# Patient Record
Sex: Male | Born: 1980 | Race: Black or African American | Hispanic: No | Marital: Single | Smoking: Light tobacco smoker
Health system: Southern US, Community
[De-identification: ages and names within clinical notes are randomized; demographics above are authoritative.]

## PROBLEM LIST (undated history)

## (undated) DIAGNOSIS — W3400XA Accidental discharge from unspecified firearms or gun, initial encounter: Secondary | ICD-10-CM

## (undated) HISTORY — PX: ANKLE FRACTURE SURGERY: SHX122

---

## 2013-09-19 ENCOUNTER — Emergency Department (HOSPITAL_COMMUNITY)
Admission: EM | Admit: 2013-09-19 | Discharge: 2013-09-19 | Disposition: A | Discharge status: Home / Self Care | Payer: Self-pay | Attending: Emergency Medicine | Admitting: Emergency Medicine

## 2013-09-19 ENCOUNTER — Encounter (HOSPITAL_COMMUNITY): Payer: Self-pay | Admitting: Emergency Medicine

## 2013-09-19 DIAGNOSIS — K089 Disorder of teeth and supporting structures, unspecified: Secondary | ICD-10-CM | POA: Insufficient documentation

## 2013-09-19 DIAGNOSIS — K029 Dental caries, unspecified: Secondary | ICD-10-CM | POA: Insufficient documentation

## 2013-09-19 DIAGNOSIS — K0889 Other specified disorders of teeth and supporting structures: Secondary | ICD-10-CM

## 2013-09-19 MED ORDER — HYDROCODONE-ACETAMINOPHEN 5-325 MG PO TABS
ORAL_TABLET | ORAL | Status: DC
Start: 2013-09-19 — End: 2018-03-16

## 2013-09-19 MED ORDER — CLINDAMYCIN HCL 300 MG PO CAPS: 300.0000 mg | ORAL_CAPSULE | Freq: Four times a day (QID) | ORAL | Status: AC

## 2013-09-19 MED ORDER — CLINDAMYCIN HCL 150 MG PO CAPS
300.0000 mg | ORAL_CAPSULE | Freq: Once | ORAL | Status: AC
  Administered 2013-09-19: 300 mg via ORAL
  Filled 2013-09-19: qty 2

## 2013-09-19 MED ORDER — HYDROCODONE-ACETAMINOPHEN 5-325 MG PO TABS: ORAL_TABLET | ORAL | Status: DC

## 2013-09-19 MED ORDER — OXYCODONE-ACETAMINOPHEN 5-325 MG PO TABS
1.0000 | ORAL_TABLET | Freq: Once | ORAL | Status: AC
  Administered 2013-09-19: 1 via ORAL
  Filled 2013-09-19: qty 1

## 2013-09-19 MED ORDER — ONDANSETRON 8 MG PO TBDP
8.0000 mg | ORAL_TABLET | Freq: Once | ORAL | Status: AC
  Administered 2013-09-19: 8 mg via ORAL
  Filled 2013-09-19: qty 1

## 2013-09-19 MED ORDER — CLINDAMYCIN HCL 300 MG PO CAPS: 300.0000 mg | ORAL_CAPSULE | Freq: Four times a day (QID) | ORAL | Status: DC

## 2013-09-19 NOTE — ED Notes (Signed)
Pt states he has pain to upper teeth area and that some "substance" keep draining and it's making him sick on his stomach. States he has been taking Motrin for last 3 days but nothing is helping.

## 2013-09-19 NOTE — Discharge Instructions (Signed)
Dental Pain  Toothache is pain in or around a tooth. It may get worse with chewing or with cold or heat.   HOME CARE  · Your dentist may use a numbing medicine during treatment. If so, you may need to avoid eating until the medicine wears off. Ask your dentist about this.  · Only take medicine as told by your dentist or doctor.  · Avoid chewing food near the painful tooth until after all treatment is done. Ask your dentist about this.  GET HELP RIGHT AWAY IF:   · The problem gets worse or new problems appear.  · You have a fever.  · There is redness and puffiness (swelling) of the face, jaw, or neck.  · You cannot open your mouth.  · There is pain in the jaw.  · There is very bad pain that is not helped by medicine.  MAKE SURE YOU:   · Understand these instructions.  · Will watch your condition.  · Will get help right away if you are not doing well or get worse.  Document Released: 08/06/2007 Document Revised: 05/12/2011 Document Reviewed: 08/06/2007  [employer]® Patient Information ©2015 [employer]. This information is not intended to replace advice given to you by your health care provider. Make sure you discuss any questions you have with your health care provider.

## 2013-09-19 NOTE — ED Provider Notes (Signed)
CSN: [id number]     Arrival date & time 09/19/13  1147 History  This chart was scribed for non-physician practitioner Pauline Aus, PA-C  working with Rolland Porter, MD, by Andrew Au, ED Scribe. This patient was seen in room APFT22/APFT22 and the patient's care was started at 12:46 PM. Chief Complaint  Patient presents with  . Abscess  . Dental Pain   Patient is a 33 y.o. male presenting with abscess and tooth pain. The history is provided by the patient. No language interpreter was used.  Abscess Associated symptoms: no fever and no headaches   Dental Pain Location:  Upper Associated symptoms: no congestion, no facial swelling, no fever, no headaches and no neck pain    Russell Mayer is a 33 y.o. male who presents to the Emergency Department complaining of burning worsening upper dental pain onset 1 week. Pt reports pain worsened within the last couple of days with associated drainage from tooth, facial pain to eye, ear and jaw, gum swelling and pain with chewing. Reports drainage from tooth make him feel sick.  Pt states he's been taking motrin without relief to pain. Pt denies making an appointment with dentist   No past medical history on file. No past surgical history on file. No family history on file. History  Substance Use Topics  . Smoking status: Not on file  . Smokeless tobacco: Not on file  . Alcohol Use: Not on file    Review of Systems  Constitutional: Negative for fever and appetite change.  HENT: Positive for dental problem. Negative for congestion, facial swelling, sore throat, tinnitus and trouble swallowing.   Eyes: Negative for pain and visual disturbance.  Respiratory: Negative for chest tightness and shortness of breath.   Musculoskeletal: Negative for neck pain and neck stiffness.  Neurological: Negative for dizziness, facial asymmetry and headaches.  Hematological: Negative for adenopathy.  All other systems reviewed and are negative.   Allergies   Review of patient's allergies indicates no known allergies.  Home Medications   Prior to Admission medications   Medication Sig Start Date End Date Taking? Authorizing Provider  ibuprofen (ADVIL,MOTRIN) 200 MG tablet Take 800 mg by mouth every 6 (six) hours as needed for moderate pain.   Yes Historical Provider, MD   Triage Vitals- BP 125/74  Pulse 67  Temp(Src) 98.1 F (36.7 C) (Oral)  Resp 18  Ht 5\' 8"  (1.727 m)  Wt 165 lb (74.844 kg)  BMI 25.09 kg/m2  SpO2 100% Physical Exam  Nursing note and vitals reviewed. Constitutional: He is oriented to person, place, and time. He appears well-developed and well-nourished. No distress.  HENT:  Head: Normocephalic and atraumatic.  Right Ear: Tympanic membrane and ear canal normal.  Left Ear: Tympanic membrane and ear canal normal.  Mouth/Throat: Uvula is midline, oropharynx is clear and moist and mucous membranes are normal. No trismus in the jaw. Dental caries present. No dental abscesses or uvula swelling.    TTP of the right upper central incisor with mild to moderate edema to surrounding gum. No drainage no facial edema or trismus.   Eyes: Conjunctivae and EOM are normal.  Neck: Normal range of motion. Neck supple.  Cardiovascular: Normal rate, regular rhythm and normal heart sounds.  Exam reveals no gallop and no friction rub.   No murmur heard. Pulmonary/Chest: Effort normal and breath sounds normal. No respiratory distress. He has no wheezes. He has no rales. He exhibits no tenderness.  Musculoskeletal: Normal range of motion.  Lymphadenopathy:  He has no cervical adenopathy.  Neurological: He is alert and oriented to person, place, and time. He exhibits normal muscle tone. Coordination normal.  Skin: Skin is warm and dry.  Psychiatric: He has a normal mood and affect. His behavior is normal.    ED Course  Procedures  DIAGNOSTIC STUDIES: Oxygen Saturation is 100% on RA, normal by my interpretation.    COORDINATION OF  CARE: 12:50 PM- Pt advised of plan for treatment which includes Abx and pt agrees. Pt has been advised to eat soft foods and to make an appointment with a dentist.  Labs Review Labs Reviewed - No data to display  Imaging Review No results found.   EKG Interpretation None      MDM   Final diagnoses:  Pain, dental   VSS.  Pt is non-toxic appearing.  No concerning sx's for infection to floor of the mouth or deep structures of the neck.  Pt agrees to close dental f/u , clindamycin and #15 vicodin for pain  I personally performed the services described in this documentation, which was scribed in my presence. The recorded information has been reviewed and is accurate.      Makinzie Considine L. Olive Zmuda, PA-C 09/22/13 1332

## 2013-09-30 NOTE — ED Provider Notes (Signed)
Medical screening examination/treatment/procedure(s) were performed by non-physician practitioner and as supervising physician I was immediately available for consultation/collaboration.   EKG Interpretation None        Han Lysne, MD 09/30/13 0014 

## 2015-06-19 ENCOUNTER — Emergency Department (HOSPITAL_COMMUNITY): Payer: Self-pay

## 2015-06-19 ENCOUNTER — Encounter (HOSPITAL_COMMUNITY): Payer: Self-pay

## 2015-06-19 ENCOUNTER — Emergency Department (HOSPITAL_COMMUNITY)
Admission: EM | Admit: 2015-06-19 | Discharge: 2015-06-19 | Disposition: A | Discharge status: Home / Self Care | Payer: Self-pay | Attending: Emergency Medicine | Admitting: Emergency Medicine

## 2015-06-19 DIAGNOSIS — M545 Low back pain, unspecified: Secondary | ICD-10-CM

## 2015-06-19 DIAGNOSIS — F17200 Nicotine dependence, unspecified, uncomplicated: Secondary | ICD-10-CM | POA: Insufficient documentation

## 2015-06-19 DIAGNOSIS — W19XXXA Unspecified fall, initial encounter: Secondary | ICD-10-CM

## 2015-06-19 MED ORDER — NAPROXEN 500 MG PO TABS: 500.0000 mg | ORAL_TABLET | Freq: Two times a day (BID) | ORAL | Status: DC

## 2015-06-19 MED ORDER — CYCLOBENZAPRINE HCL 10 MG PO TABS: 10.0000 mg | ORAL_TABLET | Freq: Two times a day (BID) | ORAL | Status: AC | PRN

## 2015-06-19 NOTE — Discharge Instructions (Signed)
Ice pack, medication for pain and muscle spasm, return if worse.

## 2015-06-19 NOTE — ED Provider Notes (Signed)
CSN: [id number]     Arrival date & time 06/19/15  1518 History   First MD Initiated Contact with Patient 06/19/15 1525     No chief complaint on file.    (Consider location/radiation/quality/duration/timing/severity/associated sxs/prior Treatment) HPI..... Patient fell approximately 10 feet off from a ladder and struck his left lower back. No head or neck trauma. No extremity trauma. He is ambulatory. Palpation makes symptoms worse. Severity is moderate.  History reviewed. No pertinent past medical history. Past Surgical History  Procedure Laterality Date  . Ankle fracture surgery     No family history on file. Social History  Substance Use Topics  . Smoking status: Light Tobacco Smoker  . Smokeless tobacco: None  . Alcohol Use: No    Review of Systems  All other systems reviewed and are negative.     Allergies  Review of patient's allergies indicates no known allergies.  Home Medications   Prior to Admission medications   Medication Sig Start Date End Date Taking? Authorizing Provider  clindamycin (CLEOCIN) 300 MG capsule Take 1 capsule (300 mg total) by mouth 4 (four) times daily. For 10 days 09/19/13   Tammy Triplett, PA-C  cyclobenzaprine (FLEXERIL) 10 MG tablet Take 1 tablet (10 mg total) by mouth 2 (two) times daily as needed for muscle spasms. 06/19/15   Donnetta HutchingBrian Sophie Quiles, MD  HYDROcodone-acetaminophen (NORCO/VICODIN) 5-325 MG per tablet Take one-two tabs po q 4-6 hrs prn pain 09/19/13   Tammy Triplett, PA-C  ibuprofen (ADVIL,MOTRIN) 200 MG tablet Take 800 mg by mouth every 6 (six) hours as needed for moderate pain.    Historical Provider, MD  naproxen (NAPROSYN) 500 MG tablet Take 1 tablet (500 mg total) by mouth 2 (two) times daily. 06/19/15   Donnetta HutchingBrian Coupland Carmack, MD   BP 110/72 mmHg  Pulse 57  Temp(Src) 98.7 F (37.1 C) (Oral)  Resp 18  Ht 5\' 8"  (1.727 m)  Wt 185 lb (83.915 kg)  BMI 28.14 kg/m2  SpO2 100% Physical Exam  Constitutional: He is oriented to person, place, and  time. He appears well-developed and well-nourished.  HENT:  Head: Normocephalic and atraumatic.  Eyes: Conjunctivae and EOM are normal. Pupils are equal, round, and reactive to light.  Neck: Normal range of motion. Neck supple.  Cardiovascular: Normal rate and regular rhythm.   Pulmonary/Chest: Effort normal and breath sounds normal.  Abdominal: Soft. Bowel sounds are normal.  Musculoskeletal:  Tender posterior pelvis medial superior aspect.  Neurological: He is alert and oriented to person, place, and time.  Skin: Skin is warm and dry.  Psychiatric: He has a normal mood and affect. His behavior is normal.  Nursing note and vitals reviewed.   ED Course  Procedures (including critical care time) Labs Review Labs Reviewed - No data to display  Imaging Review No results found. I have personally reviewed and evaluated these images and lab results as part of my medical decision-making.   EKG Interpretation None      MDM   Final diagnoses:  Fall, initial encounter  Left-sided low back pain without sciatica    Patient sustained no neck, head, extremity trauma. He has refused x-rays. Discharge medications Naprosyn 500 and Flexeril 10 mg    Donnetta HutchingBrian Chanler Schreiter, MD 06/19/15 1623

## 2015-06-19 NOTE — ED Notes (Signed)
Pt states he fell about 10 foot off a roof and landed on his left side in the dirt. Complain of pain in his lower back. Denies other symptoms

## 2015-06-19 NOTE — ED Notes (Signed)
Pt states he just wants a pain pill and to leave. States he does not think he needs a x-ray because he can move. EDP aware

## 2018-03-16 ENCOUNTER — Other Ambulatory Visit: Payer: Self-pay

## 2018-03-16 ENCOUNTER — Emergency Department (HOSPITAL_COMMUNITY)
Admission: EM | Admit: 2018-03-16 | Discharge: 2018-03-16 | Disposition: A | Discharge status: Home / Self Care | Payer: Self-pay | Attending: Emergency Medicine | Admitting: Emergency Medicine

## 2018-03-16 ENCOUNTER — Emergency Department (HOSPITAL_COMMUNITY): Payer: Self-pay

## 2018-03-16 ENCOUNTER — Encounter (HOSPITAL_COMMUNITY): Payer: Self-pay

## 2018-03-16 DIAGNOSIS — F17200 Nicotine dependence, unspecified, uncomplicated: Secondary | ICD-10-CM | POA: Insufficient documentation

## 2018-03-16 DIAGNOSIS — X58XXXD Exposure to other specified factors, subsequent encounter: Secondary | ICD-10-CM | POA: Insufficient documentation

## 2018-03-16 DIAGNOSIS — S62102D Fracture of unspecified carpal bone, left wrist, subsequent encounter for fracture with routine healing: Secondary | ICD-10-CM | POA: Insufficient documentation

## 2018-03-16 DIAGNOSIS — M25532 Pain in left wrist: Secondary | ICD-10-CM | POA: Insufficient documentation

## 2018-03-16 MED ORDER — IBUPROFEN 800 MG PO TABS: 800.0000 mg | ORAL_TABLET | Freq: Three times a day (TID) | ORAL | 0 refills | Status: AC

## 2018-03-16 MED ORDER — HYDROCODONE-ACETAMINOPHEN 7.5-325 MG PO TABS: 1.0000 | ORAL_TABLET | Freq: Four times a day (QID) | ORAL | 0 refills | Status: AC | PRN

## 2018-03-16 NOTE — Discharge Instructions (Addendum)
Keep your left arm splinted as previous.  Be sure to follow-up with your orthopedic provider in [place] on Friday.

## 2018-03-16 NOTE — ED Triage Notes (Signed)
Pt broke wrist Friday. Went to a clinic in [place] and was splinted and given a sling. Was at [employer] on Sunday and states his arm got shut in a door. Is now having throbbing pain to arm.
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
## 2018-07-29 ENCOUNTER — Ambulatory Visit: Payer: Self-pay | Admitting: Orthopaedic Surgery

## 2018-08-05 ENCOUNTER — Ambulatory Visit: Payer: Self-pay | Admitting: Orthopaedic Surgery

## 2019-03-11 ENCOUNTER — Other Ambulatory Visit: Payer: Self-pay

## 2019-03-11 ENCOUNTER — Emergency Department (HOSPITAL_COMMUNITY): Admission: EM | Admit: 2019-03-11 | Discharge: 2019-03-11 | Discharge status: Absent without leave | Payer: Self-pay

## 2019-03-12 ENCOUNTER — Other Ambulatory Visit: Payer: Self-pay

## 2019-03-12 ENCOUNTER — Emergency Department (HOSPITAL_COMMUNITY): Payer: Self-pay

## 2019-03-12 ENCOUNTER — Emergency Department (HOSPITAL_COMMUNITY)
Admission: EM | Admit: 2019-03-12 | Discharge: 2019-03-12 | Disposition: A | Discharge status: Home / Self Care | Payer: Self-pay | Attending: Emergency Medicine | Admitting: Emergency Medicine

## 2019-03-12 ENCOUNTER — Encounter (HOSPITAL_COMMUNITY): Payer: Self-pay | Admitting: *Deleted

## 2019-03-12 DIAGNOSIS — M545 Low back pain, unspecified: Secondary | ICD-10-CM

## 2019-03-12 DIAGNOSIS — F17210 Nicotine dependence, cigarettes, uncomplicated: Secondary | ICD-10-CM | POA: Insufficient documentation

## 2019-03-12 DIAGNOSIS — Z79899 Other long term (current) drug therapy: Secondary | ICD-10-CM | POA: Insufficient documentation

## 2019-03-12 HISTORY — DX: Accidental discharge from unspecified firearms or gun, initial encounter: W34.00XA

## 2019-03-12 MED ORDER — METHOCARBAMOL 500 MG PO TABS: 500.0000 mg | ORAL_TABLET | Freq: Three times a day (TID) | ORAL | 0 refills | Status: AC | PRN

## 2019-03-12 MED ORDER — NAPROXEN 500 MG PO TABS: 500.0000 mg | ORAL_TABLET | Freq: Two times a day (BID) | ORAL | 0 refills | Status: AC
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
## 2019-03-12 NOTE — ED Notes (Signed)
To rad 

## 2019-12-31 ENCOUNTER — Emergency Department (HOSPITAL_COMMUNITY)
Admission: EM | Admit: 2019-12-31 | Discharge: 2019-12-31 | Disposition: A | Discharge status: Home / Self Care | Payer: Medicaid Other | Attending: Emergency Medicine | Admitting: Emergency Medicine

## 2019-12-31 ENCOUNTER — Emergency Department (HOSPITAL_COMMUNITY): Payer: Medicaid Other

## 2019-12-31 ENCOUNTER — Encounter (HOSPITAL_COMMUNITY): Payer: Self-pay

## 2019-12-31 ENCOUNTER — Other Ambulatory Visit: Payer: Self-pay

## 2019-12-31 DIAGNOSIS — Y92524 Gas station as the place of occurrence of the external cause: Secondary | ICD-10-CM | POA: Insufficient documentation

## 2019-12-31 DIAGNOSIS — M545 Low back pain, unspecified: Secondary | ICD-10-CM

## 2019-12-31 DIAGNOSIS — M5459 Other low back pain: Secondary | ICD-10-CM | POA: Insufficient documentation

## 2019-12-31 DIAGNOSIS — R0781 Pleurodynia: Secondary | ICD-10-CM | POA: Insufficient documentation

## 2019-12-31 DIAGNOSIS — W1842XA Slipping, tripping and stumbling without falling due to stepping into hole or opening, initial encounter: Secondary | ICD-10-CM | POA: Insufficient documentation

## 2019-12-31 DIAGNOSIS — F17210 Nicotine dependence, cigarettes, uncomplicated: Secondary | ICD-10-CM | POA: Insufficient documentation

## 2019-12-31 DIAGNOSIS — S93402A Sprain of unspecified ligament of left ankle, initial encounter: Secondary | ICD-10-CM | POA: Insufficient documentation

## 2019-12-31 MED ORDER — OXYCODONE-ACETAMINOPHEN 5-325 MG PO TABS
1.0000 | ORAL_TABLET | Freq: Once | ORAL | Status: AC
  Administered 2019-12-31: 1 via ORAL
  Filled 2019-12-31: qty 1

## 2019-12-31 MED ORDER — LIDOCAINE 5 % EX PTCH
1.0000 | MEDICATED_PATCH | Freq: Once | CUTANEOUS | Status: DC
  Administered 2019-12-31: 1 via TRANSDERMAL
  Filled 2019-12-31: qty 1

## 2019-12-31 NOTE — Discharge Instructions (Signed)
Your x-rays today were reassuring.  Suspect ankle sprain and soft tissue injury.  Use ibuprofen and Tylenol.  Use Ace wrap and crutches and ice and elevation.  Follow-up with your primary care doctor if symptoms or not improving.
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
